# Patient Record
Sex: Male | Born: 1940 | Hispanic: No | Marital: Married | Smoking: Former smoker
Health system: Southern US, Community
[De-identification: ages and names within clinical notes are randomized; demographics above are authoritative.]

## PROBLEM LIST (undated history)

## (undated) DIAGNOSIS — I10 Essential (primary) hypertension: Secondary | ICD-10-CM

## (undated) DIAGNOSIS — E785 Hyperlipidemia, unspecified: Secondary | ICD-10-CM

## (undated) DIAGNOSIS — D381 Neoplasm of uncertain behavior of trachea, bronchus and lung: Secondary | ICD-10-CM

## (undated) DIAGNOSIS — R0609 Other forms of dyspnea: Secondary | ICD-10-CM

## (undated) DIAGNOSIS — N401 Enlarged prostate with lower urinary tract symptoms: Secondary | ICD-10-CM

## (undated) DIAGNOSIS — I498 Other specified cardiac arrhythmias: Secondary | ICD-10-CM

## (undated) DIAGNOSIS — R002 Palpitations: Secondary | ICD-10-CM

## (undated) DIAGNOSIS — R0989 Other specified symptoms and signs involving the circulatory and respiratory systems: Secondary | ICD-10-CM

## (undated) DIAGNOSIS — R222 Localized swelling, mass and lump, trunk: Secondary | ICD-10-CM

## (undated) DIAGNOSIS — K2960 Other gastritis without bleeding: Secondary | ICD-10-CM

## (undated) DIAGNOSIS — E119 Type 2 diabetes mellitus without complications: Secondary | ICD-10-CM

## (undated) DIAGNOSIS — D649 Anemia, unspecified: Secondary | ICD-10-CM

## (undated) DIAGNOSIS — J309 Allergic rhinitis, unspecified: Secondary | ICD-10-CM

## (undated) DIAGNOSIS — R42 Dizziness and giddiness: Secondary | ICD-10-CM

## (undated) DIAGNOSIS — R350 Frequency of micturition: Secondary | ICD-10-CM

## (undated) DIAGNOSIS — J189 Pneumonia, unspecified organism: Secondary | ICD-10-CM

## (undated) DIAGNOSIS — D3A090 Benign carcinoid tumor of the bronchus and lung: Secondary | ICD-10-CM

## (undated) DIAGNOSIS — J42 Unspecified chronic bronchitis: Secondary | ICD-10-CM

## (undated) DIAGNOSIS — Z87891 Personal history of nicotine dependence: Secondary | ICD-10-CM

## (undated) DIAGNOSIS — K055 Other periodontal diseases: Secondary | ICD-10-CM

## (undated) DIAGNOSIS — N138 Other obstructive and reflux uropathy: Secondary | ICD-10-CM

## (undated) DIAGNOSIS — K5090 Crohn's disease, unspecified, without complications: Secondary | ICD-10-CM

## (undated) HISTORY — DX: Other obstructive and reflux uropathy: N13.8

## (undated) HISTORY — DX: Other specified cardiac arrhythmias: I49.8

## (undated) HISTORY — DX: Essential (primary) hypertension: I10

## (undated) HISTORY — DX: Benign carcinoid tumor of the bronchus and lung: D3A.090

## (undated) HISTORY — DX: Anemia, unspecified: D64.9

## (undated) HISTORY — DX: Other gastritis without bleeding: K29.60

## (undated) HISTORY — DX: Other periodontal diseases: K05.5

## (undated) HISTORY — DX: Benign prostatic hyperplasia with lower urinary tract symptoms: N40.1

## (undated) HISTORY — DX: Other specified symptoms and signs involving the circulatory and respiratory systems: R09.89

## (undated) HISTORY — DX: Allergic rhinitis, unspecified: J30.9

## (undated) HISTORY — DX: Other forms of dyspnea: R06.09

## (undated) HISTORY — DX: Dizziness and giddiness: R42

## (undated) HISTORY — DX: Crohn's disease, unspecified, without complications: K50.90

## (undated) HISTORY — DX: Personal history of nicotine dependence: Z87.891

## (undated) HISTORY — DX: Unspecified chronic bronchitis: J42

## (undated) HISTORY — DX: Hyperlipidemia, unspecified: E78.5

## (undated) HISTORY — DX: Localized swelling, mass and lump, trunk: R22.2

## (undated) HISTORY — DX: Frequency of micturition: R35.0

## (undated) HISTORY — DX: Pneumonia, unspecified organism: J18.9

## (undated) HISTORY — DX: Type 2 diabetes mellitus without complications: E11.9

## (undated) HISTORY — DX: Palpitations: R00.2

## (undated) HISTORY — DX: Neoplasm of uncertain behavior of trachea, bronchus and lung: D38.1

## (undated) HISTORY — PX: LUNG SURGERY: SHX703

---

## 1998-12-12 ENCOUNTER — Other Ambulatory Visit: Admission: RE | Admit: 1998-12-12 | Discharge: 1998-12-12 | Payer: Self-pay | Admitting: Podiatry

## 2011-10-15 ENCOUNTER — Ambulatory Visit: Payer: Self-pay

## 2011-10-16 ENCOUNTER — Ambulatory Visit: Payer: Self-pay

## 2011-10-24 ENCOUNTER — Encounter: Payer: Self-pay | Admitting: Cardiothoracic Surgery

## 2011-10-25 ENCOUNTER — Ambulatory Visit (INDEPENDENT_AMBULATORY_CARE_PROVIDER_SITE_OTHER): Payer: Medicare Other | Admitting: Cardiothoracic Surgery

## 2011-10-25 DIAGNOSIS — I471 Supraventricular tachycardia: Secondary | ICD-10-CM

## 2011-10-25 DIAGNOSIS — C3410 Malignant neoplasm of upper lobe, unspecified bronchus or lung: Secondary | ICD-10-CM

## 2011-10-25 NOTE — Progress Notes (Signed)
PCP is No primary provider on file. Referring Provider is No ref. provider found                          [address]            [place] [address]          [phone]       No chief complaint on file.   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 The patient stopped smoking over 10 years ago. He recently developed symptomatic tachycardia with shortness of breath and palpitations. The patient was evaluated by Dr. Josiah Lobo who performed a stress test (myocardial perfusion study ) which the patient states was normal. A CT scan of the chest was performed which showed the mass in the left lower lobe lobe. There is no significant adenopathy in the mediastinum. The patient was evaluated by Dr. Chales Abrahams who performed a bronchoscopy showing a tumor mass at the origin of the left lower lobe bronchus. This was biopsied and histology was consistent with carcinoid tumor. The patient underwent a octreotide nuclear scan which showed the lesion in the left lower lobe the hyperactive without any other foci of uptake. The patient denies any systemic symptoms but has been troubled with recurrent left lower lobe pneumonia in the past 3 years. The patient is presenting now for discussion of surgical therapy. The patient denied any significant hemoptysis before or after the bronchoscopy and biopsy.  Review of the CT scan of the chest performed at [facility] in October demonstrates a large mass at the bifurcation of the left mainstem bronchus which may involve the left upper lobe bronchus as well. Records from Dr. Josiah Lobo regarding a cardiac evaluation are pending.   Past Medical History  Diagnosis Date  . Anemia, unspecified   . Essential hypertension, malignant   . Hypertrophy of prostate with urinary obstruction and other lower urinary tract symptoms  (LUTS)   . Neoplasm of uncertain behavior of trachea, bronchus, and lung   . Unspecified chronic bronchitis   . Other specified gastritis without mention of hemorrhage   . Type II or unspecified type diabetes mellitus without mention of complication, not stated as uncontrolled   . Snoring disorder   . Dizziness and giddiness   . Other specified periodontal diseases   . Swelling, mass, or lump in chest     CT Chest 09-07-11/3.8x4.2 cm Mass in the central LLL  . Personal history of tobacco use, presenting hazards to health   . Palpitations   . Pneumonia, organism unspecified   . Other specified cardiac dysrhythmias     history of SVT  . Urinary frequency     No past surgical history on file. the patient had a cholecystectomy for gallstone without complication.  No family history on file. the patient's sister has SVT.  Social History  the patient is retired from working as a Paediatric nurse in [employer] History  Substance Use Topics  . Smoking status: Former Smoker    Quit date: 10/19/1981  . Smokeless tobacco: Not on file  . Alcohol Use: Yes    Current Outpatient Prescriptions  Medication Sig Dispense Refill  . aspirin 81 MG tablet Take 81 mg by mouth daily.        Marland Kitchen atorvastatin (LIPITOR) 10 MG tablet Take 10 mg by  mouth at bedtime.        Marland Kitchen CALCIUM CARBONATE PO Take by mouth 2 (two) times daily. 1 tablet       . CINNAMON PO Take by mouth. 1 capsule twice daily       . digoxin (LANOXIN) 0.25 MG tablet Take 250 mcg by mouth daily.        Stephanie Acre (MSM GLUCOSAMINE COMPLEX PO) Take by mouth. 1 tablet twice daily       . mesalamine (ASACOL) 400 MG EC tablet Take 400 mg by mouth 3 (three) times daily. Oral tablet delayed release. Take 2 tablets 3 times daily       . METOPROLOL SUCCINATE ER PO Take 50 mg by mouth daily.        . Multiple Vitamin (MULTIVITAMIN) tablet Take 1 tablet by mouth daily.        . Omega-3 Fatty Acids (FISH OIL) 1200 MG CAPS Take by mouth. 2  capsules twice daily       . Potassium Chloride (KLOR-CON PO) Take 20 mEq by mouth daily. Extended release       . Tamsulosin HCl (FLOMAX) 0.4 MG CAPS Take 0.4 mg by mouth daily.        . valsartan-hydrochlorothiazide (DIOVAN-HCT) 320-25 MG per tablet Take 1 tablet by mouth daily.          Allergies  Allergen Reactions  . Decongestant     Decongestant tabs  . Penicillins     Review of Systems  Constitutional review is negative for fever weight loss. HEENT review is negative for headache change in vision or dental complaints. Thoracic review is negative her history thoracic trauma. CT scan shows the right lung to be normal. Pulmicort test performed by Dr. Chales Abrahams shows     FVC to be 4.2 with FEV1 2.7 and DL CO 16% predicted. GI clinical history of Crohn's disease by endoscopy and biopsy clinically asymptomatic Endocrine no history of diabetes no history of thyroid disease Vascular no history of TIA DVT claudication Cardiac positive for history of SVT which apparently also runs in the family, he is been on Lanoxin for several years. Neurologic no history of stroke or seizure no headache  Physical Exam  Vital signs blood pressure 140/80 pulse 78 regular afebrile General appearance--well-developed 70 year old gentleman in no acute distress accompanied by family HEENT normocephalic pupils equal dentition good. No cervical mass adenopathy JVD or bruit Thorax no tenderness or deformity. Breath sounds clear and equal bilaterally. Cardiac regular rhythm without gallop or murmur. Abdominal nontender no pulsatile mass normal bowel sounds Extremities no edema clubbing or cyanosis. Vascular 2+ palpable pulses all extremities no venous insufficiency of the legs Neurologic alert and oriented without focal motor deficit   Diagnostic Tests: I reviewed the CT scan of the chest performed at [facility] over 2 months ago. This shows a large mass at the bifurcation of the left upper and left  lower lobes. There is postobstructive atelectasis of the left lower lobe. There is no significant adenopathy or pleural effusion. The right lung is clear. The myocardial perfusion study results are reviewed which shows EF of 60% no evidence of ischemia.  Impression: Large left lower lobe bronchial carcinoid extending to the bifurcation the upper lower lobes. No evidence of other carcinoid disease by the octreotide scan.  Plan: The patient will be prepared for left VATS and left lower lobectomy versus left pneumonectomy. Prior to thoracotomy I will bronchoscope the patient to assess whether pneumonectomy will be required.  All efforts will be made to preserve as much lung as possible.  We will obtain a repeat CT scan of the chest recurrent data prior to surgery. I discussed the indications benefits and risks of the left thoracotomy and lobectomy versus pneumonectomy for the patient. He understands that if pneumonectomy is required he would still have adequate pulmonary function although his exercise tolerance would be affected.  Surgery scheduled December 12 at [facility].

## 2011-10-25 NOTE — Patient Instructions (Signed)
Stop fish oil  Surgery scheduled 12-12 2012

## 2012-04-30 DIAGNOSIS — D3A090 Benign carcinoid tumor of the bronchus and lung: Secondary | ICD-10-CM

## 2012-04-30 HISTORY — DX: Benign carcinoid tumor of the bronchus and lung: D3A.090

## 2016-05-21 DIAGNOSIS — R002 Palpitations: Secondary | ICD-10-CM | POA: Insufficient documentation

## 2016-05-21 DIAGNOSIS — I10 Essential (primary) hypertension: Secondary | ICD-10-CM

## 2016-05-21 HISTORY — DX: Essential (primary) hypertension: I10

## 2017-05-07 DIAGNOSIS — I499 Cardiac arrhythmia, unspecified: Principal | ICD-10-CM

## 2017-05-07 DIAGNOSIS — I498 Other specified cardiac arrhythmias: Secondary | ICD-10-CM | POA: Insufficient documentation

## 2017-05-23 ENCOUNTER — Encounter: Payer: Self-pay | Admitting: Cardiology

## 2017-06-10 ENCOUNTER — Ambulatory Visit: Payer: Medicare Other | Admitting: Cardiology

## 2017-06-11 ENCOUNTER — Encounter: Payer: Self-pay | Admitting: Cardiology

## 2017-06-11 ENCOUNTER — Ambulatory Visit (INDEPENDENT_AMBULATORY_CARE_PROVIDER_SITE_OTHER): Payer: Medicare HMO | Admitting: Cardiology

## 2017-06-11 VITALS — BP 108/72 | HR 53 | Ht 73.0 in | Wt 231.0 lb

## 2017-06-11 DIAGNOSIS — J309 Allergic rhinitis, unspecified: Secondary | ICD-10-CM

## 2017-06-11 DIAGNOSIS — N138 Other obstructive and reflux uropathy: Secondary | ICD-10-CM

## 2017-06-11 DIAGNOSIS — I499 Cardiac arrhythmia, unspecified: Secondary | ICD-10-CM | POA: Diagnosis not present

## 2017-06-11 DIAGNOSIS — Z85110 Personal history of malignant carcinoid tumor of bronchus and lung: Secondary | ICD-10-CM | POA: Diagnosis not present

## 2017-06-11 DIAGNOSIS — K5090 Crohn's disease, unspecified, without complications: Secondary | ICD-10-CM

## 2017-06-11 DIAGNOSIS — I10 Essential (primary) hypertension: Secondary | ICD-10-CM | POA: Diagnosis not present

## 2017-06-11 DIAGNOSIS — E785 Hyperlipidemia, unspecified: Secondary | ICD-10-CM | POA: Insufficient documentation

## 2017-06-11 DIAGNOSIS — N401 Enlarged prostate with lower urinary tract symptoms: Secondary | ICD-10-CM | POA: Insufficient documentation

## 2017-06-11 DIAGNOSIS — I498 Other specified cardiac arrhythmias: Secondary | ICD-10-CM

## 2017-06-11 DIAGNOSIS — Z87891 Personal history of nicotine dependence: Secondary | ICD-10-CM

## 2017-06-11 DIAGNOSIS — E119 Type 2 diabetes mellitus without complications: Secondary | ICD-10-CM

## 2017-06-11 DIAGNOSIS — E611 Iron deficiency: Secondary | ICD-10-CM | POA: Diagnosis not present

## 2017-06-11 HISTORY — DX: Personal history of nicotine dependence: Z87.891

## 2017-06-11 HISTORY — DX: Essential (primary) hypertension: I10

## 2017-06-11 HISTORY — DX: Allergic rhinitis, unspecified: J30.9

## 2017-06-11 HISTORY — DX: Other specified cardiac arrhythmias: I49.8

## 2017-06-11 HISTORY — DX: Other obstructive and reflux uropathy: N13.8

## 2017-06-11 HISTORY — DX: Crohn's disease, unspecified, without complications: K50.90

## 2017-06-11 HISTORY — DX: Type 2 diabetes mellitus without complications: E11.9

## 2017-06-11 NOTE — Addendum Note (Signed)
Addended by: Jossie Ng on: 06/11/2017 09:36 AM   Modules accepted: Orders

## 2017-06-11 NOTE — Progress Notes (Signed)
Cardiology Office Note:    Date:  06/11/2017   ID:  Charles Sullivan, DOB June 16, 1941, MRN [id number]  PCP:  Verdell Carmine., MD  Cardiologist:  Jenean Lindau, MD   Referring MD: Verdell Carmine., MD    ASSESSMENT:    1. Bigeminy   2. 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 Iron deficiency   4. Benign essential hypertension   5. Essential hypertension    PLAN:    In order of problems listed above:  1. I discussed my findings with the patient at extensive length. His blood pressure stable. His exercise tolerance is excellent. 2. Patient of frequent PVCs has been evaluated in the past. He is asymptomatic from this. His blood pressure stable and is under good dose of beta blockers. His ejection fraction is always been normal. I reassured him about his findings. Importance of regular exercise stressed he and his wife had multiple questions which were answered to her satisfaction.Patient will be seen in follow-up appointment in 6 months or earlier if the patient has any concerns.    Medication Adjustments/Labs and Tests Ordered: Current medicines are reviewed at length with the patient today.  Concerns regarding medicines are outlined above.  Orders Placed This Encounter  Procedures  . EKG 12-Lead   No orders of the defined types were placed in this encounter.    History of Present Illness:    Charles Sullivan is a 76 y.o. male who is being seen today for the evaluation of Essential hypertension and frequent PVCs at the request of Spry, Marsh Dolly., MD. Patient is a pleasant 76 year old male. He has past medical history of essential hypertension. He does have frequent PVCs and has been evaluated by me in the past. He walks on a regular basis. He is to follow me in my previous practice and wants to transfer his care to this practice. For this reason he is referred by his primary care physician at the patient's request. At the time of my evaluation he is alert  awake oriented and in no distress. Patient denies any chest pain orthopnea or PND. No palpitations or any syncopal spells. He has been well evaluated for his issue of PVCs in the past.  Past Medical History:  Diagnosis Date  . Allergic rhinitis 06/11/2017  . Anemia, unspecified   . Benign essential hypertension 06/11/2017  . 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  OPERATIVE PROCEDURES: On 11/26/2011-bronchoscopy, left thoracoscopic lower lobectomy with bronchoplastic closure, mediastinal lymph node dissection.  PATHOLOGY: On 11/26/2011-4.5 cm typical variant carcinoid.  Lymph node stations 5, 7, 9, 10, and 11 without evidence of malignancy.  Pathologic stage T2a N0.  ADDITIONAL THERAPY: None.  . Crohn's disease ([place]) 06/11/2017  . Diabetes mellitus type 2, controlled ([place]) 06/11/2017  . Dizziness and giddiness   . Essential hypertension 05/21/2016  . Essential hypertension, malignant   . Former smoker 06/11/2017  . Hyperlipidemia   . Hypertrophy of prostate with urinary obstruction and other lower urinary tract symptoms (LUTS)   . Neoplasm of uncertain behavior of trachea, bronchus, and lung   . Other dyspnea and respiratory abnormality   . Other specified cardiac arrhythmias 06/11/2017   Overview:  Prior episode of SVT. Followed by [facility] in [place].   Last Assessment & Plan:  Relevant Hx: Course: Daily Update: Today's Plan:  Electronically signed by: Nira Conn  Chrystine Oiler, MD 05/08/16 2046  . Other specified cardiac dysrhythmias(427.89)    history of SVT  . Other specified gastritis without mention of hemorrhage   . Other specified periodontal diseases   . Palpitations   . Personal history of tobacco use, presenting hazards to health   . Pneumonia, organism unspecified(486)   . Swelling, mass, or lump in chest    CT Chest 09-07-11/3.8x4.2 cm Mass in the  central LLL  . Type II or unspecified type diabetes mellitus without mention of complication, not stated as uncontrolled   . Unspecified chronic bronchitis ([place])   . Urinary frequency     Past Surgical History:  Procedure Laterality Date  . LUNG SURGERY      Current Medications: Current Meds  Medication Sig  . atorvastatin (LIPITOR) 10 MG tablet Take 10 mg by mouth at bedtime.    Marland Kitchen CALCIUM CARBONATE PO Take by mouth 2 (two) times daily. 1 tablet   . CINNAMON PO Take by mouth. 1 capsule twice daily   . digoxin (LANOXIN) 0.25 MG tablet Take 250 mcg by mouth daily.    Donnie Aho (MSM GLUCOSAMINE COMPLEX PO) Take by mouth. 1 tablet twice daily   . mesalamine (ASACOL) 400 MG EC tablet Take 400 mg by mouth 3 (three) times daily. Oral tablet delayed release. Take 2 tablets 3 times daily   . metoprolol succinate (TOPROL-XL) 50 MG 24 hr tablet Take 50 mg by mouth 2 (two) times daily. Take with or immediately following a meal.  . Multiple Vitamin (MULTIVITAMIN) tablet Take 1 tablet by mouth daily.    . Omega-3 Fatty Acids (FISH OIL) 1200 MG CAPS Take by mouth. 2 capsules twice daily   . Potassium Chloride (KLOR-CON PO) Take 20 mEq by mouth daily. Extended release   . Tamsulosin HCl (FLOMAX) 0.4 MG CAPS Take 0.4 mg by mouth daily.    . valsartan-hydrochlorothiazide (DIOVAN-HCT) 320-25 MG per tablet Take 1 tablet by mouth daily.       Allergies:   Clarithromycin; Penicillins; and Pseudoephedrine hcl er   Social History   Social History  . Marital status: Married    Spouse name: N/A  . Number of children: N/A  . Years of education: N/A   Social History Main Topics  . Smoking status: Former Smoker    Quit date: 10/19/1981  . Smokeless tobacco: Never Used  . Alcohol use Yes  . Drug use: No  . Sexual activity: Not Asked   Other Topics Concern  . None   Social History Narrative  . None     Family History: The patient's family history includes Breast cancer in  his sister; Cancer in his brother and father; Heart disease in his mother.  ROS:   Please see the history of present illness.    All other systems reviewed and are negative.  EKGs/Labs/Other Studies Reviewed:    The following studies were reviewed today: I reviewed his records from previous visits extensively. I confirmed that the patient is not taking digoxin therapy also.   Recent Labs: No results found for requested labs within last 8760 hours.  Recent Lipid Panel No results found for: CHOL, TRIG, HDL, CHOLHDL, VLDL, LDLCALC, LDLDIRECT  Physical Exam:    VS:  BP 108/72   Pulse (!) 53   Ht 6\' 1"  (1.854 m)   Wt 231 lb (104.8 kg)   SpO2 97%   BMI 30.48 kg/m     Wt Readings from Last 3 Encounters:  06/11/17  231 lb (104.8 kg)     GEN: Patient is in no acute distress HEENT: Normal NECK: No JVD; No carotid bruits LYMPHATICS: No lymphadenopathy CARDIAC: S1 S2 regular, 2/6 systolic murmur at the apex. RESPIRATORY:  Clear to auscultation without rales, wheezing or rhonchi  ABDOMEN: Soft, non-tender, non-distended MUSCULOSKELETAL:  No edema; No deformity  SKIN: Warm and dry NEUROLOGIC:  Alert and oriented x 3 PSYCHIATRIC:  Normal affect    Signed, Jenean Lindau, MD  06/11/2017 9:33 AM    [place]

## 2017-06-11 NOTE — Patient Instructions (Signed)
Medication Instructions:  Your physician recommends that you continue on your current medications as directed. Please refer to the Current Medication list given to you today.   Labwork: None  Testing/Procedures: You had an EKG today.  Follow-Up: Your physician wants you to follow-up in: 6 months. You will receive a reminder letter in the mail two months in advance. If you don't receive a letter, please call our office to schedule the follow-up appointment.   Any Other Special Instructions Will Be Listed Below (If Applicable).     If you need a refill on your cardiac medications before your next appointment, please call your pharmacy.

## 2018-12-12 DIAGNOSIS — E782 Mixed hyperlipidemia: Secondary | ICD-10-CM | POA: Diagnosis not present

## 2018-12-12 DIAGNOSIS — I471 Supraventricular tachycardia: Secondary | ICD-10-CM | POA: Diagnosis not present

## 2018-12-12 DIAGNOSIS — R9439 Abnormal result of other cardiovascular function study: Secondary | ICD-10-CM | POA: Diagnosis not present

## 2018-12-12 DIAGNOSIS — I10 Essential (primary) hypertension: Secondary | ICD-10-CM | POA: Diagnosis not present

## 2019-01-15 DIAGNOSIS — E782 Mixed hyperlipidemia: Secondary | ICD-10-CM | POA: Diagnosis not present

## 2019-01-15 DIAGNOSIS — E1122 Type 2 diabetes mellitus with diabetic chronic kidney disease: Secondary | ICD-10-CM | POA: Diagnosis not present

## 2019-01-15 DIAGNOSIS — N183 Chronic kidney disease, stage 3 (moderate): Secondary | ICD-10-CM | POA: Diagnosis not present

## 2019-01-19 DIAGNOSIS — N183 Chronic kidney disease, stage 3 (moderate): Secondary | ICD-10-CM | POA: Diagnosis not present

## 2019-01-19 DIAGNOSIS — E782 Mixed hyperlipidemia: Secondary | ICD-10-CM | POA: Diagnosis not present

## 2019-01-19 DIAGNOSIS — E1122 Type 2 diabetes mellitus with diabetic chronic kidney disease: Secondary | ICD-10-CM | POA: Diagnosis not present

## 2019-01-19 DIAGNOSIS — I129 Hypertensive chronic kidney disease with stage 1 through stage 4 chronic kidney disease, or unspecified chronic kidney disease: Secondary | ICD-10-CM | POA: Diagnosis not present

## 2019-03-23 ENCOUNTER — Other Ambulatory Visit: Payer: Self-pay

## 2019-04-20 ENCOUNTER — Other Ambulatory Visit: Payer: Self-pay

## 2019-04-20 NOTE — Patient Outreach (Signed)
[name]) Care Management  04/20/2019  Charles Sullivan 1941-08-20 [id number]    1st outreach attempt to the patient for initial assessment. No answer.  The phone rang seven times with no answer or voicemail pick up.  Plan: RN Health Coach will make outreach attempt to the patient within thirty business days.   Charles Arms RN, BSN, [place] Direct Dial:  [phone]  Fax: [phone]

## 2019-05-06 DIAGNOSIS — K5090 Crohn's disease, unspecified, without complications: Secondary | ICD-10-CM | POA: Diagnosis not present

## 2019-05-06 DIAGNOSIS — D500 Iron deficiency anemia secondary to blood loss (chronic): Secondary | ICD-10-CM | POA: Diagnosis not present

## 2019-05-14 ENCOUNTER — Other Ambulatory Visit: Payer: Self-pay

## 2019-05-14 NOTE — Patient Outreach (Signed)
[facility]) Care Management  05/14/2019  EVERITT WENNER 1941/01/18 [id number]    RN Health Coach closing the program.  Patient is transitioning to external program [facility] for continued case management.  [name] RN, BSN, [place] Direct Dial:  [phone]  Fax: [phone]

## 2019-05-19 ENCOUNTER — Ambulatory Visit: Payer: Self-pay

## 2019-05-19 DIAGNOSIS — E785 Hyperlipidemia, unspecified: Secondary | ICD-10-CM | POA: Diagnosis not present

## 2019-05-19 DIAGNOSIS — E782 Mixed hyperlipidemia: Secondary | ICD-10-CM | POA: Diagnosis not present

## 2019-05-19 DIAGNOSIS — E611 Iron deficiency: Secondary | ICD-10-CM | POA: Diagnosis not present

## 2019-05-19 DIAGNOSIS — E1122 Type 2 diabetes mellitus with diabetic chronic kidney disease: Secondary | ICD-10-CM | POA: Diagnosis not present

## 2019-05-19 DIAGNOSIS — I10 Essential (primary) hypertension: Secondary | ICD-10-CM | POA: Diagnosis not present

## 2019-05-19 DIAGNOSIS — N183 Chronic kidney disease, stage 3 (moderate): Secondary | ICD-10-CM | POA: Diagnosis not present

## 2019-05-21 DIAGNOSIS — J301 Allergic rhinitis due to pollen: Secondary | ICD-10-CM | POA: Diagnosis not present

## 2019-05-21 DIAGNOSIS — E611 Iron deficiency: Secondary | ICD-10-CM | POA: Diagnosis not present

## 2019-05-21 DIAGNOSIS — E1122 Type 2 diabetes mellitus with diabetic chronic kidney disease: Secondary | ICD-10-CM | POA: Diagnosis not present

## 2019-05-21 DIAGNOSIS — E782 Mixed hyperlipidemia: Secondary | ICD-10-CM | POA: Diagnosis not present

## 2019-05-21 DIAGNOSIS — I471 Supraventricular tachycardia: Secondary | ICD-10-CM | POA: Diagnosis not present

## 2019-05-21 DIAGNOSIS — N401 Enlarged prostate with lower urinary tract symptoms: Secondary | ICD-10-CM | POA: Diagnosis not present

## 2019-05-21 DIAGNOSIS — K5090 Crohn's disease, unspecified, without complications: Secondary | ICD-10-CM | POA: Diagnosis not present

## 2019-05-21 DIAGNOSIS — N183 Chronic kidney disease, stage 3 (moderate): Secondary | ICD-10-CM | POA: Diagnosis not present

## 2019-05-21 DIAGNOSIS — Z0000 Encounter for general adult medical examination without abnormal findings: Secondary | ICD-10-CM | POA: Diagnosis not present

## 2019-05-21 DIAGNOSIS — N138 Other obstructive and reflux uropathy: Secondary | ICD-10-CM | POA: Diagnosis not present

## 2019-05-21 DIAGNOSIS — I129 Hypertensive chronic kidney disease with stage 1 through stage 4 chronic kidney disease, or unspecified chronic kidney disease: Secondary | ICD-10-CM | POA: Diagnosis not present

## 2019-06-12 DIAGNOSIS — R9439 Abnormal result of other cardiovascular function study: Secondary | ICD-10-CM | POA: Diagnosis not present

## 2019-06-12 DIAGNOSIS — E782 Mixed hyperlipidemia: Secondary | ICD-10-CM | POA: Diagnosis not present

## 2019-06-12 DIAGNOSIS — I10 Essential (primary) hypertension: Secondary | ICD-10-CM | POA: Diagnosis not present

## 2019-06-12 DIAGNOSIS — I471 Supraventricular tachycardia: Secondary | ICD-10-CM | POA: Diagnosis not present

## 2019-06-17 DIAGNOSIS — K5090 Crohn's disease, unspecified, without complications: Secondary | ICD-10-CM | POA: Diagnosis not present

## 2019-07-22 DIAGNOSIS — K5090 Crohn's disease, unspecified, without complications: Secondary | ICD-10-CM | POA: Diagnosis not present

## 2019-07-22 DIAGNOSIS — D500 Iron deficiency anemia secondary to blood loss (chronic): Secondary | ICD-10-CM | POA: Diagnosis not present

## 2019-09-23 DIAGNOSIS — Z23 Encounter for immunization: Secondary | ICD-10-CM | POA: Diagnosis not present

## 2019-09-23 DIAGNOSIS — E782 Mixed hyperlipidemia: Secondary | ICD-10-CM | POA: Diagnosis not present

## 2019-09-23 DIAGNOSIS — I129 Hypertensive chronic kidney disease with stage 1 through stage 4 chronic kidney disease, or unspecified chronic kidney disease: Secondary | ICD-10-CM | POA: Diagnosis not present

## 2019-09-23 DIAGNOSIS — N183 Chronic kidney disease, stage 3 unspecified: Secondary | ICD-10-CM | POA: Diagnosis not present

## 2019-09-23 DIAGNOSIS — E1122 Type 2 diabetes mellitus with diabetic chronic kidney disease: Secondary | ICD-10-CM | POA: Diagnosis not present

## 2019-09-29 DIAGNOSIS — L570 Actinic keratosis: Secondary | ICD-10-CM | POA: Diagnosis not present

## 2019-12-09 DIAGNOSIS — Z20828 Contact with and (suspected) exposure to other viral communicable diseases: Secondary | ICD-10-CM | POA: Diagnosis not present

## 2019-12-09 DIAGNOSIS — R05 Cough: Secondary | ICD-10-CM | POA: Diagnosis not present

## 2020-01-01 DIAGNOSIS — R55 Syncope and collapse: Secondary | ICD-10-CM | POA: Diagnosis not present

## 2020-01-01 DIAGNOSIS — I959 Hypotension, unspecified: Secondary | ICD-10-CM | POA: Diagnosis not present

## 2020-01-01 DIAGNOSIS — N183 Chronic kidney disease, stage 3 unspecified: Secondary | ICD-10-CM | POA: Diagnosis not present

## 2020-01-01 DIAGNOSIS — I471 Supraventricular tachycardia: Secondary | ICD-10-CM | POA: Diagnosis not present

## 2020-01-01 DIAGNOSIS — E1122 Type 2 diabetes mellitus with diabetic chronic kidney disease: Secondary | ICD-10-CM | POA: Diagnosis not present

## 2020-01-02 DIAGNOSIS — I498 Other specified cardiac arrhythmias: Secondary | ICD-10-CM | POA: Diagnosis not present

## 2020-01-02 DIAGNOSIS — I493 Ventricular premature depolarization: Secondary | ICD-10-CM | POA: Diagnosis not present

## 2020-01-08 DIAGNOSIS — R0602 Shortness of breath: Secondary | ICD-10-CM | POA: Diagnosis not present

## 2020-01-08 DIAGNOSIS — Z87891 Personal history of nicotine dependence: Secondary | ICD-10-CM | POA: Diagnosis not present

## 2020-01-08 DIAGNOSIS — I493 Ventricular premature depolarization: Secondary | ICD-10-CM | POA: Diagnosis not present

## 2020-01-08 DIAGNOSIS — K5090 Crohn's disease, unspecified, without complications: Secondary | ICD-10-CM | POA: Diagnosis not present

## 2020-01-08 DIAGNOSIS — R002 Palpitations: Secondary | ICD-10-CM | POA: Diagnosis not present

## 2020-01-08 DIAGNOSIS — I10 Essential (primary) hypertension: Secondary | ICD-10-CM | POA: Diagnosis not present

## 2020-01-08 DIAGNOSIS — E785 Hyperlipidemia, unspecified: Secondary | ICD-10-CM | POA: Diagnosis not present

## 2020-01-08 DIAGNOSIS — I129 Hypertensive chronic kidney disease with stage 1 through stage 4 chronic kidney disease, or unspecified chronic kidney disease: Secondary | ICD-10-CM | POA: Diagnosis not present

## 2020-01-08 DIAGNOSIS — R9439 Abnormal result of other cardiovascular function study: Secondary | ICD-10-CM | POA: Diagnosis not present

## 2020-01-08 DIAGNOSIS — E782 Mixed hyperlipidemia: Secondary | ICD-10-CM | POA: Diagnosis not present

## 2020-01-08 DIAGNOSIS — D509 Iron deficiency anemia, unspecified: Secondary | ICD-10-CM | POA: Diagnosis not present

## 2020-01-08 DIAGNOSIS — R0989 Other specified symptoms and signs involving the circulatory and respiratory systems: Secondary | ICD-10-CM | POA: Diagnosis not present

## 2020-01-08 DIAGNOSIS — R778 Other specified abnormalities of plasma proteins: Secondary | ICD-10-CM | POA: Diagnosis not present

## 2020-01-08 DIAGNOSIS — E860 Dehydration: Secondary | ICD-10-CM | POA: Diagnosis not present

## 2020-01-08 DIAGNOSIS — R000 Tachycardia, unspecified: Secondary | ICD-10-CM | POA: Diagnosis not present

## 2020-01-08 DIAGNOSIS — N183 Chronic kidney disease, stage 3 unspecified: Secondary | ICD-10-CM | POA: Diagnosis not present

## 2020-01-08 DIAGNOSIS — E1122 Type 2 diabetes mellitus with diabetic chronic kidney disease: Secondary | ICD-10-CM | POA: Diagnosis not present

## 2020-01-08 DIAGNOSIS — N401 Enlarged prostate with lower urinary tract symptoms: Secondary | ICD-10-CM | POA: Diagnosis not present

## 2020-01-08 DIAGNOSIS — Z8616 Personal history of COVID-19: Secondary | ICD-10-CM | POA: Diagnosis not present

## 2020-01-08 DIAGNOSIS — I440 Atrioventricular block, first degree: Secondary | ICD-10-CM | POA: Diagnosis not present

## 2020-01-08 DIAGNOSIS — H9190 Unspecified hearing loss, unspecified ear: Secondary | ICD-10-CM | POA: Diagnosis not present

## 2020-01-08 DIAGNOSIS — R7989 Other specified abnormal findings of blood chemistry: Secondary | ICD-10-CM | POA: Diagnosis not present

## 2020-01-08 DIAGNOSIS — R42 Dizziness and giddiness: Secondary | ICD-10-CM | POA: Diagnosis not present

## 2020-01-08 DIAGNOSIS — R531 Weakness: Secondary | ICD-10-CM | POA: Diagnosis not present

## 2020-01-08 DIAGNOSIS — I471 Supraventricular tachycardia: Secondary | ICD-10-CM | POA: Diagnosis not present

## 2020-01-11 DIAGNOSIS — E1122 Type 2 diabetes mellitus with diabetic chronic kidney disease: Secondary | ICD-10-CM | POA: Diagnosis not present

## 2020-01-11 DIAGNOSIS — R9439 Abnormal result of other cardiovascular function study: Secondary | ICD-10-CM | POA: Diagnosis not present

## 2020-01-11 DIAGNOSIS — C44311 Basal cell carcinoma of skin of nose: Secondary | ICD-10-CM | POA: Diagnosis not present

## 2020-01-11 DIAGNOSIS — N401 Enlarged prostate with lower urinary tract symptoms: Secondary | ICD-10-CM | POA: Diagnosis not present

## 2020-01-11 DIAGNOSIS — K5000 Crohn's disease of small intestine without complications: Secondary | ICD-10-CM | POA: Diagnosis not present

## 2020-01-11 DIAGNOSIS — N138 Other obstructive and reflux uropathy: Secondary | ICD-10-CM | POA: Diagnosis not present

## 2020-01-11 DIAGNOSIS — R002 Palpitations: Secondary | ICD-10-CM | POA: Diagnosis not present

## 2020-01-11 DIAGNOSIS — N183 Chronic kidney disease, stage 3 unspecified: Secondary | ICD-10-CM | POA: Diagnosis not present

## 2020-01-11 DIAGNOSIS — I471 Supraventricular tachycardia: Secondary | ICD-10-CM | POA: Diagnosis not present

## 2020-01-11 DIAGNOSIS — I129 Hypertensive chronic kidney disease with stage 1 through stage 4 chronic kidney disease, or unspecified chronic kidney disease: Secondary | ICD-10-CM | POA: Diagnosis not present

## 2020-02-25 DIAGNOSIS — E782 Mixed hyperlipidemia: Secondary | ICD-10-CM | POA: Diagnosis not present

## 2020-02-25 DIAGNOSIS — N183 Chronic kidney disease, stage 3 unspecified: Secondary | ICD-10-CM | POA: Diagnosis not present

## 2020-02-25 DIAGNOSIS — I10 Essential (primary) hypertension: Secondary | ICD-10-CM | POA: Diagnosis not present

## 2020-02-25 DIAGNOSIS — E1122 Type 2 diabetes mellitus with diabetic chronic kidney disease: Secondary | ICD-10-CM | POA: Diagnosis not present

## 2020-02-29 DIAGNOSIS — E782 Mixed hyperlipidemia: Secondary | ICD-10-CM | POA: Diagnosis not present

## 2020-02-29 DIAGNOSIS — I129 Hypertensive chronic kidney disease with stage 1 through stage 4 chronic kidney disease, or unspecified chronic kidney disease: Secondary | ICD-10-CM | POA: Diagnosis not present

## 2020-02-29 DIAGNOSIS — E1122 Type 2 diabetes mellitus with diabetic chronic kidney disease: Secondary | ICD-10-CM | POA: Diagnosis not present

## 2020-02-29 DIAGNOSIS — N183 Chronic kidney disease, stage 3 unspecified: Secondary | ICD-10-CM | POA: Diagnosis not present

## 2020-03-03 DIAGNOSIS — K5090 Crohn's disease, unspecified, without complications: Secondary | ICD-10-CM | POA: Diagnosis not present

## 2020-03-03 DIAGNOSIS — D500 Iron deficiency anemia secondary to blood loss (chronic): Secondary | ICD-10-CM | POA: Diagnosis not present

## 2020-05-18 DIAGNOSIS — E785 Hyperlipidemia, unspecified: Secondary | ICD-10-CM | POA: Diagnosis not present

## 2020-05-18 DIAGNOSIS — Z87891 Personal history of nicotine dependence: Secondary | ICD-10-CM | POA: Diagnosis not present

## 2020-05-18 DIAGNOSIS — I471 Supraventricular tachycardia: Secondary | ICD-10-CM | POA: Diagnosis not present

## 2020-05-18 DIAGNOSIS — E119 Type 2 diabetes mellitus without complications: Secondary | ICD-10-CM | POA: Diagnosis not present

## 2020-05-18 DIAGNOSIS — I10 Essential (primary) hypertension: Secondary | ICD-10-CM | POA: Diagnosis not present

## 2020-05-18 DIAGNOSIS — Z8616 Personal history of COVID-19: Secondary | ICD-10-CM | POA: Diagnosis not present

## 2020-05-30 DIAGNOSIS — I471 Supraventricular tachycardia: Secondary | ICD-10-CM | POA: Diagnosis not present

## 2020-05-30 DIAGNOSIS — J301 Allergic rhinitis due to pollen: Secondary | ICD-10-CM | POA: Diagnosis not present

## 2020-05-30 DIAGNOSIS — N183 Chronic kidney disease, stage 3 unspecified: Secondary | ICD-10-CM | POA: Diagnosis not present

## 2020-05-30 DIAGNOSIS — E1122 Type 2 diabetes mellitus with diabetic chronic kidney disease: Secondary | ICD-10-CM | POA: Diagnosis not present

## 2020-05-30 DIAGNOSIS — N138 Other obstructive and reflux uropathy: Secondary | ICD-10-CM | POA: Diagnosis not present

## 2020-05-30 DIAGNOSIS — N401 Enlarged prostate with lower urinary tract symptoms: Secondary | ICD-10-CM | POA: Diagnosis not present

## 2020-05-30 DIAGNOSIS — I10 Essential (primary) hypertension: Secondary | ICD-10-CM | POA: Diagnosis not present

## 2020-05-30 DIAGNOSIS — E611 Iron deficiency: Secondary | ICD-10-CM | POA: Diagnosis not present

## 2020-05-30 DIAGNOSIS — I129 Hypertensive chronic kidney disease with stage 1 through stage 4 chronic kidney disease, or unspecified chronic kidney disease: Secondary | ICD-10-CM | POA: Diagnosis not present

## 2020-05-30 DIAGNOSIS — Z0000 Encounter for general adult medical examination without abnormal findings: Secondary | ICD-10-CM | POA: Diagnosis not present

## 2020-05-30 DIAGNOSIS — K5000 Crohn's disease of small intestine without complications: Secondary | ICD-10-CM | POA: Diagnosis not present

## 2020-05-30 DIAGNOSIS — E782 Mixed hyperlipidemia: Secondary | ICD-10-CM | POA: Diagnosis not present

## 2020-08-06 ENCOUNTER — Encounter (HOSPITAL_BASED_OUTPATIENT_CLINIC_OR_DEPARTMENT_OTHER): Payer: Self-pay | Admitting: Emergency Medicine

## 2020-08-06 ENCOUNTER — Emergency Department (HOSPITAL_BASED_OUTPATIENT_CLINIC_OR_DEPARTMENT_OTHER): Payer: PPO

## 2020-08-06 ENCOUNTER — Other Ambulatory Visit: Payer: Self-pay

## 2020-08-06 ENCOUNTER — Emergency Department (HOSPITAL_BASED_OUTPATIENT_CLINIC_OR_DEPARTMENT_OTHER)
Admission: EM | Admit: 2020-08-06 | Discharge: 2020-08-06 | Disposition: A | Discharge status: Home / Self Care | Payer: PPO | Attending: Emergency Medicine | Admitting: Emergency Medicine

## 2020-08-06 DIAGNOSIS — I10 Essential (primary) hypertension: Secondary | ICD-10-CM | POA: Diagnosis not present

## 2020-08-06 DIAGNOSIS — D381 Neoplasm of uncertain behavior of trachea, bronchus and lung: Secondary | ICD-10-CM | POA: Insufficient documentation

## 2020-08-06 DIAGNOSIS — E119 Type 2 diabetes mellitus without complications: Secondary | ICD-10-CM | POA: Insufficient documentation

## 2020-08-06 DIAGNOSIS — Z79899 Other long term (current) drug therapy: Secondary | ICD-10-CM | POA: Insufficient documentation

## 2020-08-06 DIAGNOSIS — R55 Syncope and collapse: Secondary | ICD-10-CM

## 2020-08-06 DIAGNOSIS — Y92000 Kitchen of unspecified non-institutional (private) residence as  the place of occurrence of the external cause: Secondary | ICD-10-CM | POA: Diagnosis not present

## 2020-08-06 DIAGNOSIS — R4182 Altered mental status, unspecified: Secondary | ICD-10-CM | POA: Diagnosis not present

## 2020-08-06 DIAGNOSIS — Y31XXXA Falling, lying or running before or into moving object, undetermined intent, initial encounter: Secondary | ICD-10-CM | POA: Diagnosis not present

## 2020-08-06 DIAGNOSIS — Z7984 Long term (current) use of oral hypoglycemic drugs: Secondary | ICD-10-CM | POA: Insufficient documentation

## 2020-08-06 DIAGNOSIS — Z87891 Personal history of nicotine dependence: Secondary | ICD-10-CM | POA: Insufficient documentation

## 2020-08-06 DIAGNOSIS — S0001XA Abrasion of scalp, initial encounter: Secondary | ICD-10-CM | POA: Diagnosis not present

## 2020-08-06 DIAGNOSIS — S0990XA Unspecified injury of head, initial encounter: Secondary | ICD-10-CM

## 2020-08-06 DIAGNOSIS — Y9389 Activity, other specified: Secondary | ICD-10-CM | POA: Diagnosis not present

## 2020-08-06 LAB — URINALYSIS, ROUTINE W REFLEX MICROSCOPIC
Bilirubin Urine: NEGATIVE
Glucose, UA: NEGATIVE mg/dL
Hgb urine dipstick: NEGATIVE
Ketones, ur: NEGATIVE mg/dL
Leukocytes,Ua: NEGATIVE
Nitrite: NEGATIVE
Protein, ur: NEGATIVE mg/dL
Specific Gravity, Urine: 1.025 (ref 1.005–1.030)
pH: 6 (ref 5.0–8.0)

## 2020-08-06 LAB — BASIC METABOLIC PANEL
Anion gap: 10 (ref 5–15)
BUN: 13 mg/dL (ref 8–23)
CO2: 24 mmol/L (ref 22–32)
Calcium: 8.9 mg/dL (ref 8.9–10.3)
Chloride: 102 mmol/L (ref 98–111)
Creatinine, Ser: 1.01 mg/dL (ref 0.61–1.24)
GFR calc Af Amer: >60 mL/min (ref >60)
GFR calc non Af Amer: >60 mL/min (ref >60)
Glucose, Bld: 129 mg/dL — ABNORMAL HIGH (ref 70–99)
Potassium: 4.3 mmol/L (ref 3.5–5.1)
Sodium: 136 mmol/L (ref 135–145)

## 2020-08-06 LAB — CBC
HCT: 46.7 % (ref 39.0–52.0)
Hemoglobin: 15.4 g/dL (ref 13.0–17.0)
MCH: 30.1 pg (ref 26.0–34.0)
MCHC: 33 g/dL (ref 30.0–36.0)
MCV: 91.4 fL (ref 80.0–100.0)
Platelets: 185 10*3/uL (ref 150–400)
RBC: 5.11 MIL/uL (ref 4.22–5.81)
RDW: 13.4 % (ref 11.5–15.5)
WBC: 8.6 10*3/uL (ref 4.0–10.5)
nRBC: 0 % (ref 0.0–0.2)

## 2020-08-06 LAB — CBG MONITORING, ED: Glucose-Capillary: 135 mg/dL — ABNORMAL HIGH (ref 70–99)

## 2020-08-06 LAB — TROPONIN I (HIGH SENSITIVITY): Troponin I (High Sensitivity): 7 ng/L (ref <18)

## 2020-08-06 NOTE — Discharge Instructions (Addendum)
1.  Return to the emergency department immediately if you develop chest pain, irregular heartbeat, feel like you will pass out or other concerning symptoms. 2.  Schedule a follow-up check with your doctor within the next 2 to 4 days.

## 2020-08-06 NOTE — ED Notes (Signed)
Patient transported to X-ray and CT 

## 2020-08-06 NOTE — ED Provider Notes (Signed)
[facility] EMERGENCY DEPARTMENT Provider Note   CSN: [id number] Arrival date & time: 08/06/20  1013     History Chief Complaint  Patient presents with  . Loss of Consciousness    Charles Sullivan is a 79 y.o. male.  HPI Patient has syncopal episodes 3 days prior to presentation.  He reports he had been sitting in the kitchen having breakfast with his wife.  He was helping her eat.  He stood up and pretty quickly found himself lying on the floor.  He reports he did not have any chest pain or headache preceding this episode.  He did hit the back of his head but did not go on to develop any headache.  He did not feel that he really had any ongoing symptoms.  His daughter checked on him and he seemed to be doing fine.  He however started to complain of some dizziness.  That seem to have resolved and he had a normal day on Friday.  Saturday however he again complained of some dizziness and some nausea.  Patient is denying any symptoms at this time.  They were concerned because he had bumped his head although it did not seem very hard.  Patient is not on any blood thinners.  He has not been experiencing any palpitations, shortness of breath or chest pain.    Past Medical History:  Diagnosis Date  . Allergic rhinitis 06/11/2017  . Anemia, unspecified   . Benign essential hypertension 06/11/2017  . 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  OPERATIVE PROCEDURES: On 11/26/2011-bronchoscopy, left thoracoscopic lower lobectomy with bronchoplastic closure, mediastinal lymph node dissection.  PATHOLOGY: On 11/26/2011-4.5 cm typical variant carcinoid.  Lymph node stations 5, 7, 9, 10, and 11 without evidence of malignancy.  Pathologic stage T2a N0.  ADDITIONAL THERAPY: None.  . Crohn's disease ([place]) 06/11/2017  . Diabetes mellitus type 2, controlled ([place]) 06/11/2017  . Dizziness and  giddiness   . Essential hypertension 05/21/2016  . Essential hypertension, malignant   . Former smoker 06/11/2017  . Hyperlipidemia   . Hypertrophy of prostate with urinary obstruction and other lower urinary tract symptoms (LUTS)   . Neoplasm of uncertain behavior of trachea, bronchus, and lung   . Other dyspnea and respiratory abnormality   . Other specified cardiac arrhythmias 06/11/2017   Overview:  Prior episode of SVT. Followed by [facility] in [place].   Last Assessment & Plan:  Relevant Hx: Course: Daily Update: Today's Plan:  Electronically signed by: Simmie Davies, MD 05/08/16 2046  . Other specified cardiac dysrhythmias(427.89)    history of SVT  . Other specified gastritis without mention of hemorrhage   . Other specified periodontal diseases   . Palpitations   . Personal history of tobacco use, presenting hazards to health   . Pneumonia, organism unspecified(486)   . Swelling, mass, or lump in chest    CT Chest 09-07-11/3.8x4.2 cm Mass in the central LLL  . Type II or unspecified type diabetes mellitus without mention of complication, not stated as uncontrolled   . 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 Urinary frequency     Patient Active Problem List   Diagnosis Date Noted  . Allergic rhinitis 06/11/2017  . 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 Crohn's disease ([place]) 06/11/2017  . Diabetes mellitus type 2, controlled ([place]) 06/11/2017  . Former smoker 06/11/2017  .  Hyperlipidemia 06/11/2017  . 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 Iron deficiency 06/11/2017  . Other specified cardiac arrhythmias 06/11/2017  . [place] 05/07/2017  . Essential hypertension 05/21/2016  . Palpitations 05/21/2016  . 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 LUNG SURGERY         Family History  Problem Relation Age of Onset  . Heart disease  Mother   . Cancer Father   . Breast cancer Sister   . Cancer Brother     Social History   Tobacco Use  . Smoking status: Former Smoker    Quit date: 10/19/1981    Years since quitting: 38.8  . Smokeless tobacco: Never Used  Vaping Use  . Vaping Use: Never used  Substance Use Topics  . Alcohol use: Yes  . Drug use: No    Home Medications Prior to Admission medications   Medication Sig Start Date End Date Taking? Authorizing Provider  atorvastatin (LIPITOR) 20 MG tablet Take 1 tablet by mouth daily. 02/29/20  Yes [provider]  atorvastatin (LIPITOR) 10 MG tablet Take 10 mg by mouth at bedtime.      [provider]  CALCIUM CARBONATE PO Take by mouth 2 (two) times daily. 1 tablet     [provider]  CINNAMON PO Take by mouth. 1 capsule twice daily     [provider]  Glucos-MSM-C-Mn-Ginger-Willow (MSM GLUCOSAMINE COMPLEX PO) Take by mouth. 1 tablet twice daily     [provider]  mesalamine (ASACOL) 400 MG EC tablet Take 400 mg by mouth 3 (three) times daily. Oral tablet delayed release. Take 2 tablets 3 times daily     [provider]  metoprolol succinate (TOPROL-XL) 50 MG 24 hr tablet Take 50 mg by mouth 2 (two) times daily. Take with or immediately following a meal.    [provider]  Multiple Vitamin (MULTIVITAMIN) tablet Take 1 tablet by mouth daily.      [provider]  Omega-3 Fatty Acids (FISH OIL) 1200 MG CAPS Take by mouth. 2 capsules twice daily     [provider]  Potassium Chloride (KLOR-CON PO) Take 20 mEq by mouth daily. Extended release     [provider]  Tamsulosin HCl (FLOMAX) 0.4 MG CAPS Take 0.4 mg by mouth daily.      [provider]  valsartan-hydrochlorothiazide (DIOVAN-HCT) 320-25 MG per tablet Take 1 tablet by mouth daily.      [provider]    Allergies    Clarithromycin, Penicillins, and Pseudoephedrine hcl er  Review of Systems     Review of Systems 10 systems reviewed and negative except as per HPI Physical Exam Updated Vital Signs BP (!) 148/67 (BP Location: Right Arm)   Pulse (!) 50   Temp 98.1 F (36.7 C) (Oral)   Resp 16   Ht 6\' 1"  (1.854 m)   Wt 99.8 kg   SpO2 99%   BMI 29.03 kg/m   Physical Exam Constitutional:      Appearance: He is well-developed.  HENT:     Head:     Comments: Very superficial 1.5 cm abrasion to top of scalp.  No associated hematoma.    Nose: Nose normal.     Mouth/Throat:     Mouth: Mucous membranes are moist.     Pharynx: Oropharynx is clear.  Eyes:     Extraocular Movements: Extraocular movements  intact.     Pupils: Pupils are equal, round, and reactive to light.  Cardiovascular:     Rate and Rhythm: Normal rate and regular rhythm.     Heart sounds: Normal heart sounds.  Pulmonary:     Effort: Pulmonary effort is normal.     Breath sounds: Normal breath sounds.  Abdominal:     General: Bowel sounds are normal. There is no distension.     Palpations: Abdomen is soft.     Tenderness: There is no abdominal tenderness.  Musculoskeletal:        General: Normal range of motion.     Cervical back: Neck supple.  Skin:    General: Skin is warm and dry.  Neurological:     General: No focal deficit present.     Mental Status: He is alert and oriented to person, place, and time.     GCS: GCS eye subscore is 4. GCS verbal subscore is 5. GCS motor subscore is 6.     Cranial Nerves: No cranial nerve deficit.     Motor: No weakness.     Coordination: Coordination normal.  Psychiatric:        Mood and Affect: Mood normal.     ED Results / Procedures / Treatments   Labs (all labs ordered are listed, but only abnormal results are displayed) Labs Reviewed  BASIC METABOLIC PANEL - Abnormal; Notable for the following components:      Result Value   Glucose, Bld 129 (*)    All other components within normal limits  CBG MONITORING, ED - Abnormal; Notable for the following  components:   Glucose-Capillary 135 (*)    All other components within normal limits  CBC  URINALYSIS, ROUTINE W REFLEX MICROSCOPIC  TROPONIN I (HIGH SENSITIVITY)    EKG EKG Interpretation  Date/Time:  Saturday August 06 2020 10:25:53 EDT Ventricular Rate:  55 PR Interval:  206 QRS Duration: 98 QT Interval:  414 QTC Calculation: 396 R Axis:   8 Text Interpretation: Sinus bradycardia Otherwise normal ECG agree, no old comparison Confirmed by Charlesetta Shanks [phone]) on 08/06/2020 10:46:46 AM   Radiology CT Head Wo Contrast  Result Date: 08/06/2020 CLINICAL DATA:  Head trauma.  Syncope. EXAM: CT HEAD WITHOUT CONTRAST TECHNIQUE: Contiguous axial images were obtained from the base of the skull through the vertex without intravenous contrast. COMPARISON:  None. FINDINGS: Brain: Moderate to severe white matter changes. No subdural, epidural, or subarachnoid hemorrhage. Cerebellum and brainstem are normal. Ventricles, sulci, and basal cisterns are normal. No mass effect or midline shift. Vascular: No hyperdense vessel or unexpected calcification. Skull: Normal. Negative for fracture or focal lesion. Sinuses/Orbits: No acute finding. Other: None. IMPRESSION: No acute intracranial abnormalities.  Chronic white matter changes. Electronically Signed   By: Dorise Bullion III M.D   On: 08/06/2020 12:14   DG Chest Port 1 View  Result Date: 08/06/2020 CLINICAL DATA:  Syncope. EXAM: PORTABLE CHEST 1 VIEW COMPARISON:  None. FINDINGS: The heart size and mediastinal contours are within normal limits. Both lungs are clear. The visualized skeletal structures are unremarkable. IMPRESSION: No active disease. Electronically Signed   By: Dorise Bullion III M.D   On: 08/06/2020 12:15    Procedures Procedures (including critical care time)  Medications Ordered in ED Medications - No data to display  ED Course  I have reviewed the triage vital signs and the nursing notes.  Pertinent labs & imaging  results that were available during my care of the patient  were reviewed by me and considered in my medical decision making (see chart for details).    MDM Rules/Calculators/A&P                         Patient is clinically well in appearance.  Neurologic exam is normal.  CT head does not show any evidence of intracranial injury.  At this time, stable for further out patient observation for minor head injury.  Family is doing frequent checks and observation.  Patient has no complaints of headache.  Patient had syncopal episode 3 days ago.  No prodromal symptoms.  At this time I most suspect orthostatic syncope.  Patient had been sitting having breakfast with his wife and stood up and shortly thereafter lost consciousness.  No elevation in troponin or ischemic-looking EKG changes.  Recommendations for close follow-up with PCP and return precautions reviewed.  Final Clinical Impression(s) / ED Diagnoses Final diagnoses:  Syncope and collapse  Minor head injury, initial encounter    Rx / DC Orders ED Discharge Orders    None       Charlesetta Shanks, MD 08/07/20 [phone]

## 2020-08-06 NOTE — ED Triage Notes (Signed)
Syncopal episode on Thursday. States he stood up from the table and then woke up on the floor. Reports pain to the back of his head. No blood thinners. Reports ongoing nausea.

## 2020-08-06 NOTE — ED Notes (Signed)
Cancel 2nd trop per pt request and ok per EDP Pfeiffer

## 2020-08-06 NOTE — ED Notes (Addendum)
Pt has hearing aid, does not hear well. Daughter at bedside

## 2020-09-01 DIAGNOSIS — D500 Iron deficiency anemia secondary to blood loss (chronic): Secondary | ICD-10-CM | POA: Diagnosis not present

## 2020-09-01 DIAGNOSIS — K5090 Crohn's disease, unspecified, without complications: Secondary | ICD-10-CM | POA: Diagnosis not present

## 2020-09-27 DIAGNOSIS — D1801 Hemangioma of skin and subcutaneous tissue: Secondary | ICD-10-CM | POA: Diagnosis not present

## 2020-09-27 DIAGNOSIS — L570 Actinic keratosis: Secondary | ICD-10-CM | POA: Diagnosis not present

## 2020-09-27 DIAGNOSIS — L821 Other seborrheic keratosis: Secondary | ICD-10-CM | POA: Diagnosis not present

## 2020-10-17 DIAGNOSIS — I129 Hypertensive chronic kidney disease with stage 1 through stage 4 chronic kidney disease, or unspecified chronic kidney disease: Secondary | ICD-10-CM | POA: Diagnosis not present

## 2020-10-17 DIAGNOSIS — I10 Essential (primary) hypertension: Secondary | ICD-10-CM | POA: Diagnosis not present

## 2020-10-17 DIAGNOSIS — N183 Chronic kidney disease, stage 3 unspecified: Secondary | ICD-10-CM | POA: Diagnosis not present

## 2020-10-17 DIAGNOSIS — E538 Deficiency of other specified B group vitamins: Secondary | ICD-10-CM | POA: Diagnosis not present

## 2020-10-17 DIAGNOSIS — E1122 Type 2 diabetes mellitus with diabetic chronic kidney disease: Secondary | ICD-10-CM | POA: Diagnosis not present

## 2020-10-17 DIAGNOSIS — R4182 Altered mental status, unspecified: Secondary | ICD-10-CM | POA: Diagnosis not present

## 2020-10-17 DIAGNOSIS — Z23 Encounter for immunization: Secondary | ICD-10-CM | POA: Diagnosis not present

## 2020-10-17 DIAGNOSIS — E782 Mixed hyperlipidemia: Secondary | ICD-10-CM | POA: Diagnosis not present

## 2021-02-06 DIAGNOSIS — I471 Supraventricular tachycardia: Secondary | ICD-10-CM | POA: Diagnosis not present

## 2021-02-06 DIAGNOSIS — I129 Hypertensive chronic kidney disease with stage 1 through stage 4 chronic kidney disease, or unspecified chronic kidney disease: Secondary | ICD-10-CM | POA: Diagnosis not present

## 2021-02-06 DIAGNOSIS — E538 Deficiency of other specified B group vitamins: Secondary | ICD-10-CM | POA: Diagnosis not present

## 2021-02-06 DIAGNOSIS — E1122 Type 2 diabetes mellitus with diabetic chronic kidney disease: Secondary | ICD-10-CM | POA: Diagnosis not present

## 2021-02-06 DIAGNOSIS — N183 Chronic kidney disease, stage 3 unspecified: Secondary | ICD-10-CM | POA: Diagnosis not present

## 2021-02-06 DIAGNOSIS — E782 Mixed hyperlipidemia: Secondary | ICD-10-CM | POA: Diagnosis not present

## 2021-03-02 DIAGNOSIS — K5090 Crohn's disease, unspecified, without complications: Secondary | ICD-10-CM | POA: Diagnosis not present

## 2021-03-02 DIAGNOSIS — D500 Iron deficiency anemia secondary to blood loss (chronic): Secondary | ICD-10-CM | POA: Diagnosis not present

## 2021-03-18 IMAGING — CT CT HEAD W/O CM
3 series · 16 of 47 positions shown, 19 images · non-contrast
Comparison: None.

CLINICAL DATA: Head trauma.  Syncope.

EXAM:
CT HEAD WITHOUT CONTRAST
TECHNIQUE: Contiguous axial images were obtained from the base of the skull
through the vertex without intravenous contrast.

[Series 2: head wo · axial · 0.45mm/px · z∈[-153,-13]mm · 10 of 34 slices shown, 13 images]
[im 3/34  brain]
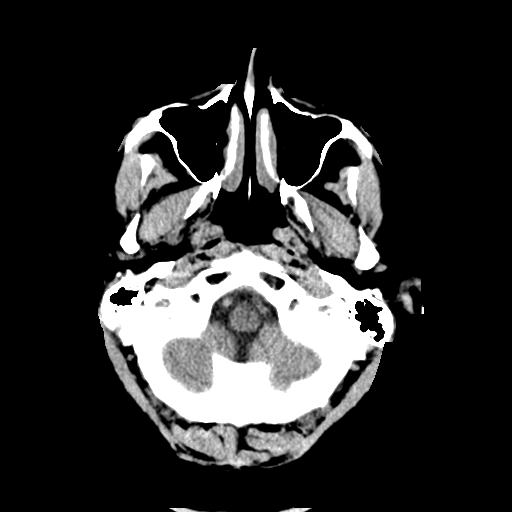
[im 3/34  bone]
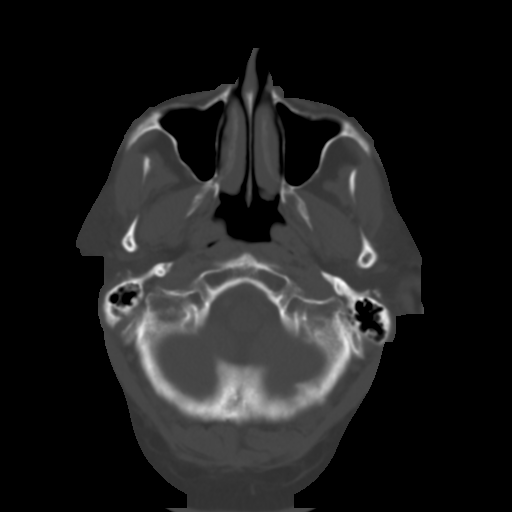
[im 6/34  brain]
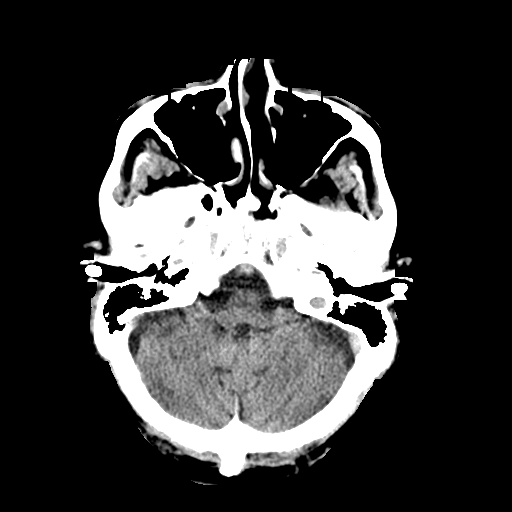
[im 10/34  brain]
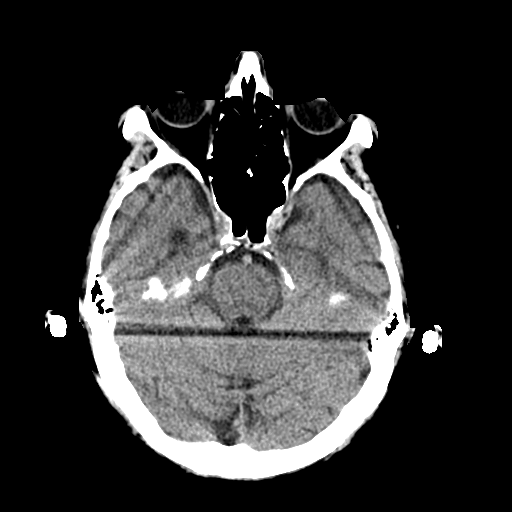
[im 12/34  brain]
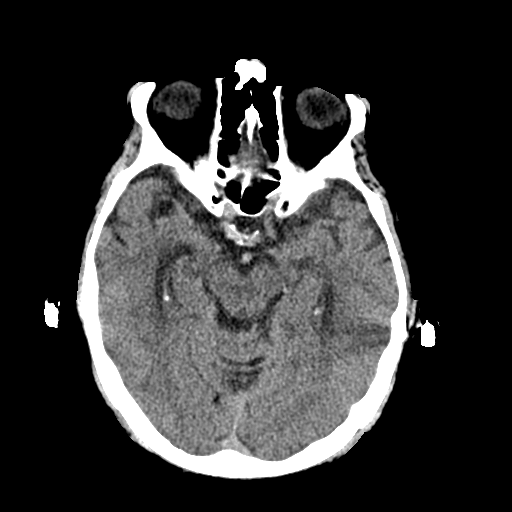
[im 15/34  brain]
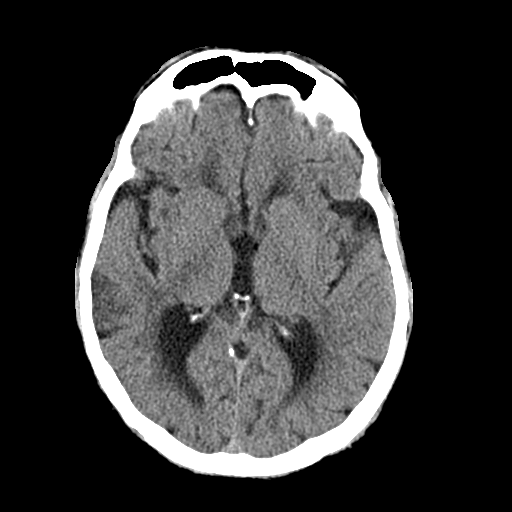
[im 15/34  bone]
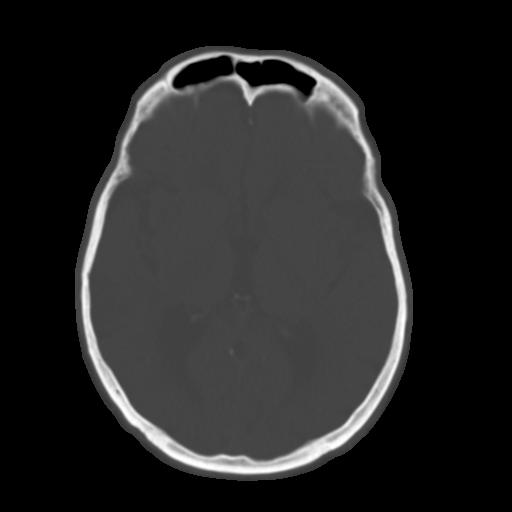
[im 19/34  brain]
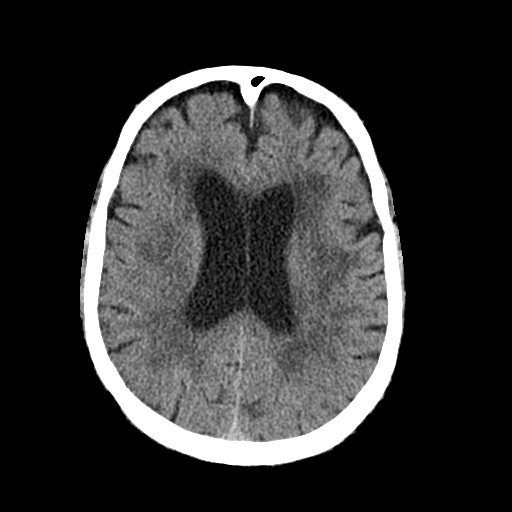
[im 22/34  brain]
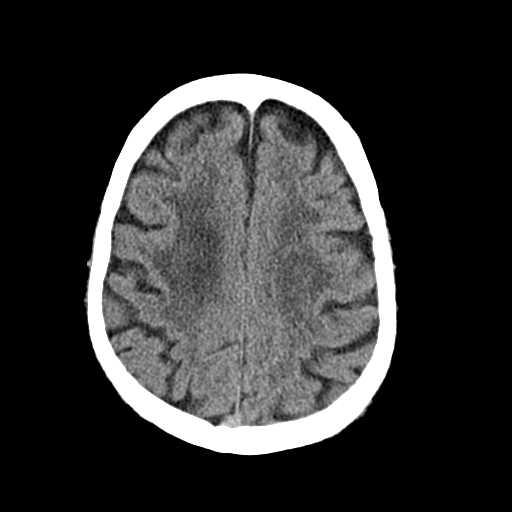
[im 26/34  brain]
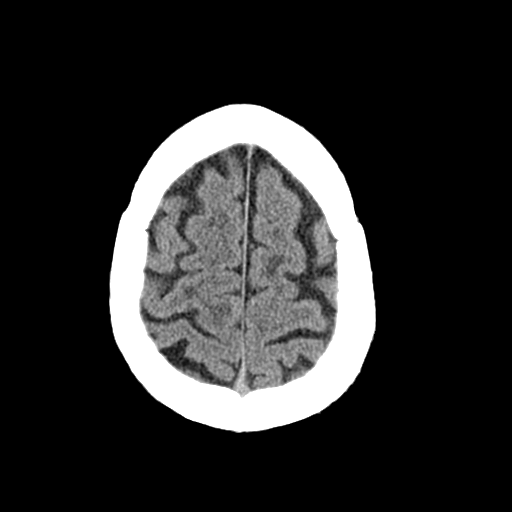
[im 28/34  brain]
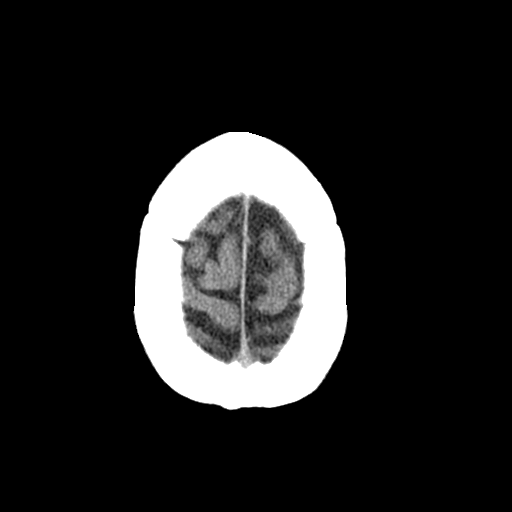
[im 28/34  bone]
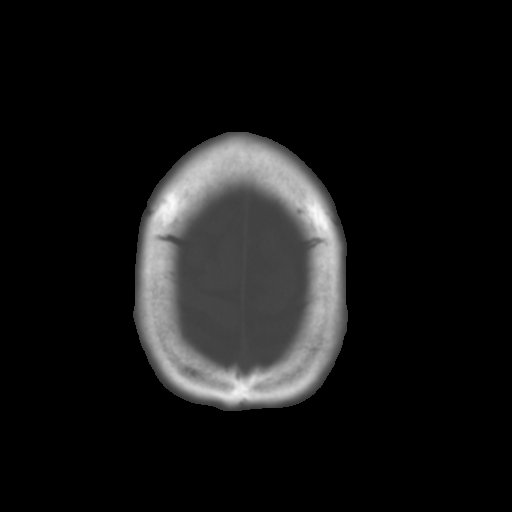
[im 31/34  brain]
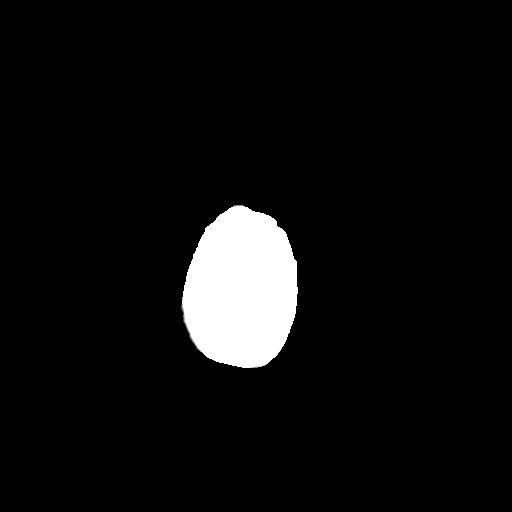

[Series 4: cor soft · coronal · 0.33mm/px · 3 of 73 slices shown]
[im 25/73  brain]
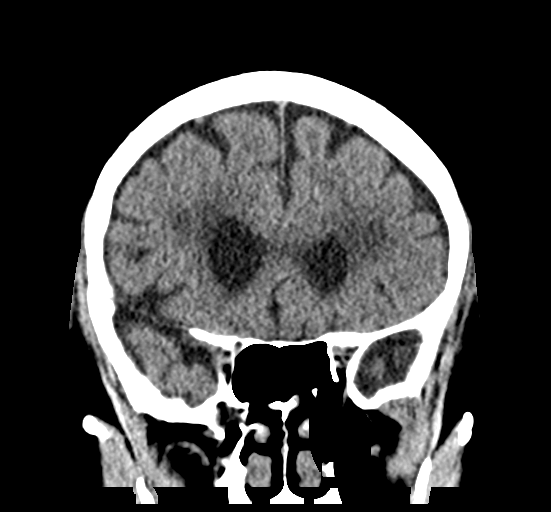
[im 33/73  brain]
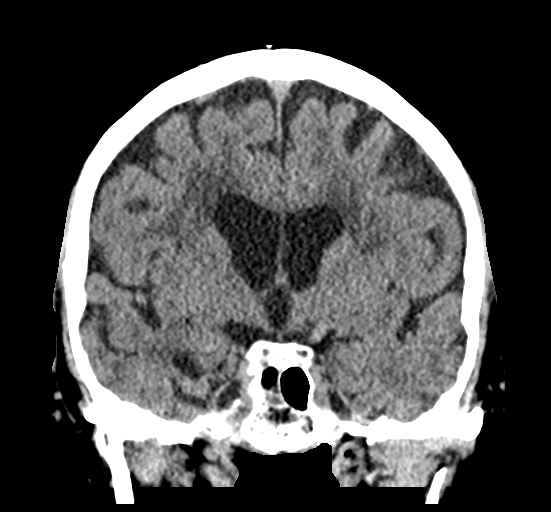
[im 41/73  brain]
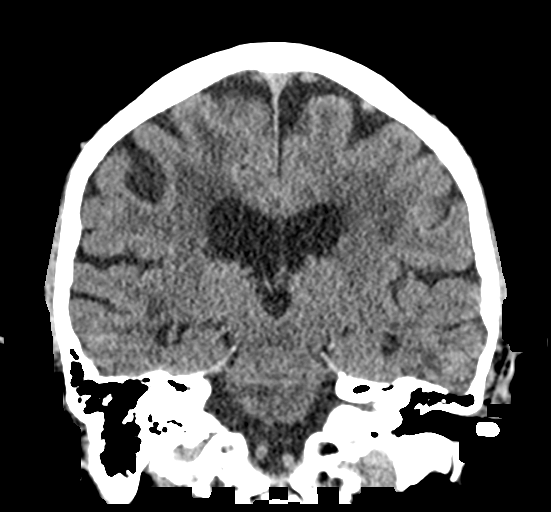

[Series 5: sag soft · sagittal · 0.33mm/px · 3 of 57 slices shown]
[im 19/57  brain]
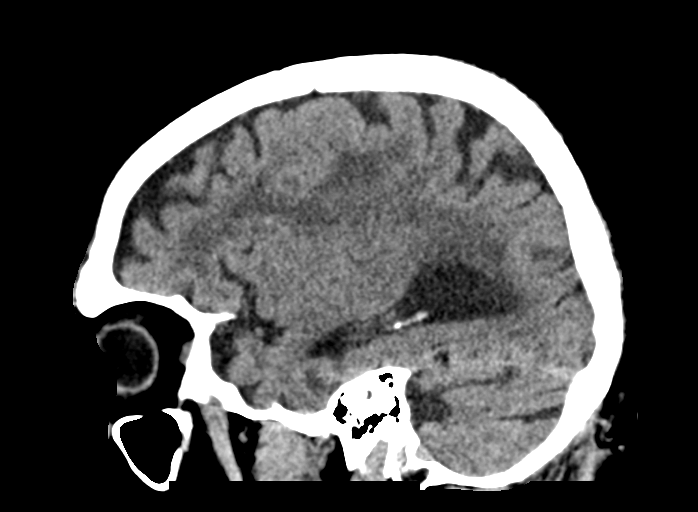
[im 29/57  brain]
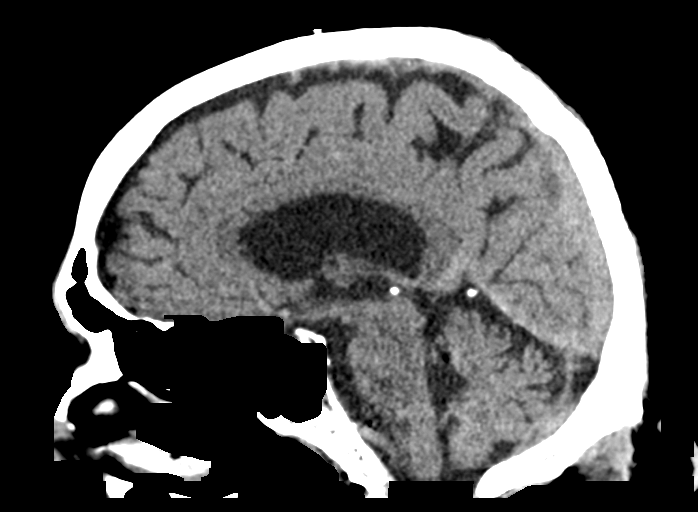
[im 38/57  brain]
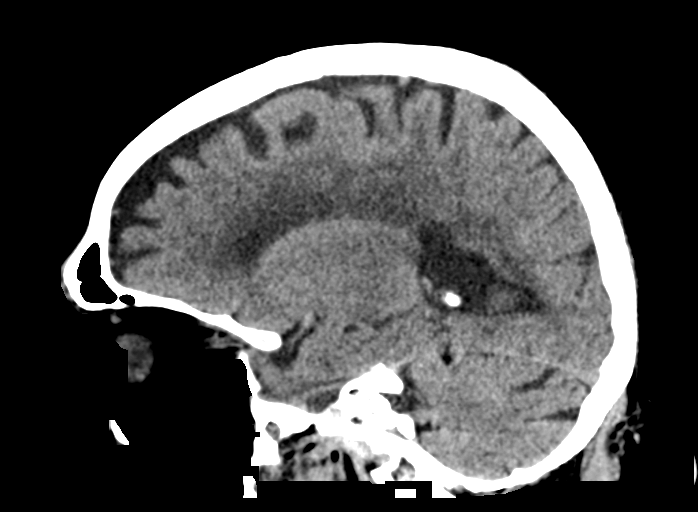

[16 of 47 positions shown; findings below may reference images not displayed]

FINDINGS: Brain: Moderate to severe white matter changes. No subdural,
epidural, or subarachnoid hemorrhage. Cerebellum and brainstem are
normal. Ventricles, sulci, and basal cisterns are normal. No mass
effect or midline shift.

Vascular: No hyperdense vessel or unexpected calcification.

Skull: Normal. Negative for fracture or focal lesion.

Sinuses/Orbits: No acute finding.

Other: None.
IMPRESSION: No acute intracranial abnormalities.  Chronic white matter changes.

## 2021-05-02 DIAGNOSIS — F4321 Adjustment disorder with depressed mood: Secondary | ICD-10-CM | POA: Diagnosis not present

## 2021-05-02 DIAGNOSIS — G3184 Mild cognitive impairment, so stated: Secondary | ICD-10-CM | POA: Diagnosis not present

## 2021-05-17 DIAGNOSIS — I10 Essential (primary) hypertension: Secondary | ICD-10-CM | POA: Diagnosis not present

## 2021-05-17 DIAGNOSIS — E785 Hyperlipidemia, unspecified: Secondary | ICD-10-CM | POA: Diagnosis not present

## 2021-05-17 DIAGNOSIS — I471 Supraventricular tachycardia: Secondary | ICD-10-CM | POA: Diagnosis not present

## 2021-05-17 DIAGNOSIS — Z85118 Personal history of other malignant neoplasm of bronchus and lung: Secondary | ICD-10-CM | POA: Diagnosis not present

## 2021-05-17 DIAGNOSIS — E119 Type 2 diabetes mellitus without complications: Secondary | ICD-10-CM | POA: Diagnosis not present

## 2021-06-02 DIAGNOSIS — I471 Supraventricular tachycardia: Secondary | ICD-10-CM | POA: Diagnosis not present

## 2021-06-02 DIAGNOSIS — K5000 Crohn's disease of small intestine without complications: Secondary | ICD-10-CM | POA: Diagnosis not present

## 2021-06-02 DIAGNOSIS — I129 Hypertensive chronic kidney disease with stage 1 through stage 4 chronic kidney disease, or unspecified chronic kidney disease: Secondary | ICD-10-CM | POA: Diagnosis not present

## 2021-06-02 DIAGNOSIS — E538 Deficiency of other specified B group vitamins: Secondary | ICD-10-CM | POA: Diagnosis not present

## 2021-06-02 DIAGNOSIS — N183 Chronic kidney disease, stage 3 unspecified: Secondary | ICD-10-CM | POA: Diagnosis not present

## 2021-06-02 DIAGNOSIS — E1122 Type 2 diabetes mellitus with diabetic chronic kidney disease: Secondary | ICD-10-CM | POA: Diagnosis not present

## 2021-06-02 DIAGNOSIS — E782 Mixed hyperlipidemia: Secondary | ICD-10-CM | POA: Diagnosis not present

## 2021-06-02 DIAGNOSIS — N138 Other obstructive and reflux uropathy: Secondary | ICD-10-CM | POA: Diagnosis not present

## 2021-06-02 DIAGNOSIS — N401 Enlarged prostate with lower urinary tract symptoms: Secondary | ICD-10-CM | POA: Diagnosis not present

## 2021-06-02 DIAGNOSIS — I10 Essential (primary) hypertension: Secondary | ICD-10-CM | POA: Diagnosis not present

## 2021-06-02 DIAGNOSIS — F4321 Adjustment disorder with depressed mood: Secondary | ICD-10-CM | POA: Diagnosis not present

## 2021-06-02 DIAGNOSIS — G3184 Mild cognitive impairment, so stated: Secondary | ICD-10-CM | POA: Diagnosis not present

## 2021-06-02 DIAGNOSIS — Z0000 Encounter for general adult medical examination without abnormal findings: Secondary | ICD-10-CM | POA: Diagnosis not present

## 2021-08-12 DIAGNOSIS — L03112 Cellulitis of left axilla: Secondary | ICD-10-CM | POA: Diagnosis not present

## 2021-09-05 DIAGNOSIS — R300 Dysuria: Secondary | ICD-10-CM | POA: Diagnosis not present

## 2021-09-05 DIAGNOSIS — N419 Inflammatory disease of prostate, unspecified: Secondary | ICD-10-CM | POA: Diagnosis not present

## 2021-09-05 DIAGNOSIS — R350 Frequency of micturition: Secondary | ICD-10-CM | POA: Diagnosis not present

## 2021-09-06 DIAGNOSIS — N401 Enlarged prostate with lower urinary tract symptoms: Secondary | ICD-10-CM | POA: Diagnosis not present

## 2021-09-06 DIAGNOSIS — N138 Other obstructive and reflux uropathy: Secondary | ICD-10-CM | POA: Diagnosis not present

## 2021-09-06 DIAGNOSIS — N400 Enlarged prostate without lower urinary tract symptoms: Secondary | ICD-10-CM | POA: Diagnosis not present

## 2021-09-06 DIAGNOSIS — R339 Retention of urine, unspecified: Secondary | ICD-10-CM | POA: Diagnosis not present

## 2021-09-06 DIAGNOSIS — K7689 Other specified diseases of liver: Secondary | ICD-10-CM | POA: Diagnosis not present

## 2021-09-06 DIAGNOSIS — N281 Cyst of kidney, acquired: Secondary | ICD-10-CM | POA: Diagnosis not present

## 2021-09-07 DIAGNOSIS — E1122 Type 2 diabetes mellitus with diabetic chronic kidney disease: Secondary | ICD-10-CM | POA: Diagnosis not present

## 2021-09-07 DIAGNOSIS — G3184 Mild cognitive impairment, so stated: Secondary | ICD-10-CM | POA: Diagnosis not present

## 2021-09-07 DIAGNOSIS — N183 Chronic kidney disease, stage 3 unspecified: Secondary | ICD-10-CM | POA: Diagnosis not present

## 2021-09-12 DIAGNOSIS — R339 Retention of urine, unspecified: Secondary | ICD-10-CM | POA: Diagnosis not present

## 2021-09-12 DIAGNOSIS — N401 Enlarged prostate with lower urinary tract symptoms: Secondary | ICD-10-CM | POA: Diagnosis not present

## 2021-09-12 DIAGNOSIS — H9190 Unspecified hearing loss, unspecified ear: Secondary | ICD-10-CM | POA: Diagnosis not present

## 2021-09-12 DIAGNOSIS — Z79899 Other long term (current) drug therapy: Secondary | ICD-10-CM | POA: Diagnosis not present

## 2021-09-13 DIAGNOSIS — I471 Supraventricular tachycardia: Secondary | ICD-10-CM | POA: Diagnosis not present

## 2021-09-13 DIAGNOSIS — N179 Acute kidney failure, unspecified: Secondary | ICD-10-CM | POA: Diagnosis not present

## 2021-09-13 DIAGNOSIS — N138 Other obstructive and reflux uropathy: Secondary | ICD-10-CM | POA: Diagnosis not present

## 2021-09-13 DIAGNOSIS — N183 Chronic kidney disease, stage 3 unspecified: Secondary | ICD-10-CM | POA: Diagnosis not present

## 2021-09-13 DIAGNOSIS — N390 Urinary tract infection, site not specified: Secondary | ICD-10-CM | POA: Diagnosis not present

## 2021-09-13 DIAGNOSIS — N401 Enlarged prostate with lower urinary tract symptoms: Secondary | ICD-10-CM | POA: Diagnosis not present

## 2021-09-13 DIAGNOSIS — R319 Hematuria, unspecified: Secondary | ICD-10-CM | POA: Diagnosis not present

## 2021-09-13 DIAGNOSIS — R339 Retention of urine, unspecified: Secondary | ICD-10-CM | POA: Diagnosis not present

## 2021-09-13 DIAGNOSIS — E1122 Type 2 diabetes mellitus with diabetic chronic kidney disease: Secondary | ICD-10-CM | POA: Diagnosis not present

## 2021-09-20 DIAGNOSIS — N401 Enlarged prostate with lower urinary tract symptoms: Secondary | ICD-10-CM | POA: Diagnosis not present

## 2021-09-20 DIAGNOSIS — R339 Retention of urine, unspecified: Secondary | ICD-10-CM | POA: Diagnosis not present

## 2021-09-22 DIAGNOSIS — R339 Retention of urine, unspecified: Secondary | ICD-10-CM | POA: Diagnosis not present

## 2021-09-29 DIAGNOSIS — L570 Actinic keratosis: Secondary | ICD-10-CM | POA: Diagnosis not present

## 2021-10-02 DIAGNOSIS — E782 Mixed hyperlipidemia: Secondary | ICD-10-CM | POA: Diagnosis not present

## 2021-10-04 DIAGNOSIS — N401 Enlarged prostate with lower urinary tract symptoms: Secondary | ICD-10-CM | POA: Diagnosis not present

## 2021-10-04 DIAGNOSIS — E782 Mixed hyperlipidemia: Secondary | ICD-10-CM | POA: Diagnosis not present

## 2021-10-04 DIAGNOSIS — N183 Chronic kidney disease, stage 3 unspecified: Secondary | ICD-10-CM | POA: Diagnosis not present

## 2021-10-04 DIAGNOSIS — N138 Other obstructive and reflux uropathy: Secondary | ICD-10-CM | POA: Diagnosis not present

## 2021-10-04 DIAGNOSIS — I129 Hypertensive chronic kidney disease with stage 1 through stage 4 chronic kidney disease, or unspecified chronic kidney disease: Secondary | ICD-10-CM | POA: Diagnosis not present

## 2021-10-04 DIAGNOSIS — E1122 Type 2 diabetes mellitus with diabetic chronic kidney disease: Secondary | ICD-10-CM | POA: Diagnosis not present

## 2021-10-04 DIAGNOSIS — Z23 Encounter for immunization: Secondary | ICD-10-CM | POA: Diagnosis not present

## 2021-10-16 DIAGNOSIS — R339 Retention of urine, unspecified: Secondary | ICD-10-CM | POA: Diagnosis not present

## 2021-10-16 DIAGNOSIS — N401 Enlarged prostate with lower urinary tract symptoms: Secondary | ICD-10-CM | POA: Diagnosis not present

## 2021-10-16 DIAGNOSIS — N390 Urinary tract infection, site not specified: Secondary | ICD-10-CM | POA: Diagnosis not present

## 2021-11-06 DIAGNOSIS — N401 Enlarged prostate with lower urinary tract symptoms: Secondary | ICD-10-CM | POA: Diagnosis not present

## 2021-11-06 DIAGNOSIS — R339 Retention of urine, unspecified: Secondary | ICD-10-CM | POA: Diagnosis not present
